# Patient Record
Sex: Male | Born: 1955 | Race: White | Hispanic: No | Marital: Married | Smoking: Former smoker
Health system: Southern US, Community
[De-identification: ages and names within clinical notes are randomized; demographics above are authoritative.]

## PROBLEM LIST (undated history)

## (undated) DIAGNOSIS — R634 Abnormal weight loss: Secondary | ICD-10-CM

## (undated) DIAGNOSIS — Z87891 Personal history of nicotine dependence: Secondary | ICD-10-CM

## (undated) DIAGNOSIS — R202 Paresthesia of skin: Secondary | ICD-10-CM

## (undated) DIAGNOSIS — R61 Generalized hyperhidrosis: Secondary | ICD-10-CM

## (undated) HISTORY — DX: Personal history of nicotine dependence: Z87.891

## (undated) HISTORY — PX: HERNIA REPAIR: SHX51

## (undated) HISTORY — DX: Abnormal weight loss: R63.4

## (undated) HISTORY — DX: Generalized hyperhidrosis: R61

## (undated) HISTORY — DX: Paresthesia of skin: R20.2

## (undated) HISTORY — PX: MIDDLE EAR SURGERY: SHX713

---
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
## 2007-04-06 ENCOUNTER — Emergency Department (HOSPITAL_COMMUNITY): Admission: EM | Admit: 2007-04-06 | Discharge: 2007-04-06 | Payer: Self-pay | Admitting: Emergency Medicine

## 2011-03-29 NOTE — Consult Note (Signed)
Javier Beltran, Javier Beltran                ACCOUNT NO.:  [id number]   MEDICAL RECORD NO.:  [id number]          PATIENT TYPE:  EMS   LOCATION:  [facility]                         FACILITY:  [facility]   PHYSICIAN:  Corky Crafts, MDDATE OF BIRTH:  1956-09-21   DATE OF CONSULTATION:  04/06/2007  DATE OF DISCHARGE:                                 CONSULTATION   REFERRING PHYSICIAN:  Chales Salmon. Abigail Miyamoto, M.D.   REASON FOR CONSULTATION:  Atrial fibrillation.   HISTORY OF PRESENT ILLNESS:  The patient is a 55 year old man who had a  24-hour history of dyspnea, chest pressure and palpitations with  lightheadedness.  He went to his primary care doctor's office today and  was found to be in atrial fibrillation with a heart rate ranging from  106 to 120.  He was transported to [facility].  While  en route to the hospital, he spontaneously converted to a normal sinus  rhythm.  He apparently was having an IV placed and had a vagal reaction.  There was a period of asystole as well and the patient passed out.  He  came to on his own.  He states that he has had syncope related to  phlebotomy each of the last 2 years during his annual physical.  It has  not happened at any other times.  He exercises at least a couple times a  week, and he does not have any significant chest pain or shortness of  breath.  The only thing he did differently last night was that he had  two alcoholic beverages which he normally does not.  He has never had  any thyroid problems.  He does not use caffeine excessively.   PAST MEDICAL HISTORY:  1. Depressions/anxiety.  2. Hematuria   ALLERGIES:  NO KNOWN DRUG ALLERGIES.   MEDICATIONS:  1. Lexapro 10 mg a day.  2. Aspirin 81 mg day.   SOCIAL HISTORY:  The patient does not smoke.  He does not use any  illegal drugs.  He usually drinks one beer a day but he did have more  alcohol last night than usual.   FAMILY HISTORY:  Mother is 76 alive and well with  hypertension.  Father  is 61 and alive and well.  Brother has thyroid disease.   REVIEW OF SYSTEMS:  No recent fevers, chills, no weight loss, no focal  weakness, no rash.  All other systems negative.   PHYSICAL EXAMINATION:  VITAL SIGNS:  Blood pressure is 130/82, heart  rate 88.  GENERAL:  The patient is awake and alert, no apparent distress.  HEAD:  Normocephalic, atraumatic.  EYES:  Extraocular movements intact.  NECK:  No carotid bruits.  CARDIOVASCULAR:  Regular rate and rhythm, S1-S2.  LUNGS:  Clear to auscultation bilaterally.  ABDOMEN:  Soft, nontender, nondistended.  No pulsatile mass.  EXTREMITIES:  Showed no edema, no calf tenderness.  NEUROLOGIC:  No focal deficits.  SKIN:  No rash.  BACK:  No kyphosis.  PSYCHIATRIC:  Normal mood and affect.   EKG:  Initial EKG showed atrial fibrillation with a  heart rate of 106.  The EKG at [facility] showed normal sinus rhythm, no pathologic Q-waves,  no ST-T wave changes, normal ECG.   LABORATORY:  Shows normal troponin, normal CK, and normal MB.  B-MET is  normal.  LFTs are normal.  INR 1.  Hematocrit 47.4, platelets of 139.  Chest x-ray:  No acute abnormalities per my read.   MEDICAL DECISION-MAKING:  A 55 year old with new onset paroxysmal atrial  fibrillation.   PLAN:  1. We will start metoprolol 25 mg extended-release daily.  Hopefully,      this will help keep his heart rates lower if he dose go back into      atrial fibrillation.  2. Aspirin 325 mg daily should be started.  He is already taking 81 mg      a day but I think 325 is a more studied dose in atrial      fibrillation.  3. Since he really does not have any other cardiac issues, he does not      require Coumadin.  4. I will plan on seeing the patient back in the office.  We will      perform an echo to rule out structural heart disease.  I have asked      him to take easy over the weekend and not perform any strenuous      activity.      Corky Crafts, MD  Electronically Signed     JSV/MEDQ  D:  04/06/2007  T:  04/06/2007  Job:  [id number]

## 2012-03-09 ENCOUNTER — Other Ambulatory Visit: Payer: Self-pay | Admitting: Family Medicine

## 2012-03-09 DIAGNOSIS — R51 Headache: Secondary | ICD-10-CM

## 2012-03-15 ENCOUNTER — Ambulatory Visit
Admission: RE | Admit: 2012-03-15 | Discharge: 2012-03-15 | Disposition: A | Discharge status: Home / Self Care | Payer: BC Managed Care – PPO | Source: Ambulatory Visit | Attending: Family Medicine | Admitting: Family Medicine

## 2012-03-15 DIAGNOSIS — R51 Headache: Secondary | ICD-10-CM

## 2012-03-19 ENCOUNTER — Other Ambulatory Visit: Payer: Self-pay | Admitting: Family Medicine

## 2012-03-19 ENCOUNTER — Ambulatory Visit
Admission: RE | Admit: 2012-03-19 | Discharge: 2012-03-19 | Disposition: A | Discharge status: Home / Self Care | Payer: BC Managed Care – PPO | Source: Ambulatory Visit | Attending: Family Medicine | Admitting: Family Medicine

## 2012-03-19 DIAGNOSIS — R51 Headache: Secondary | ICD-10-CM

## 2012-04-11 ENCOUNTER — Ambulatory Visit: Payer: BC Managed Care – PPO | Admitting: Infectious Disease

## 2012-04-26 ENCOUNTER — Ambulatory Visit: Payer: BC Managed Care – PPO | Admitting: Infectious Disease

## 2012-04-30 ENCOUNTER — Ambulatory Visit: Payer: BC Managed Care – PPO | Admitting: Infectious Disease

## 2012-07-04 ENCOUNTER — Encounter: Payer: Self-pay | Admitting: Infectious Disease

## 2012-07-04 ENCOUNTER — Ambulatory Visit (INDEPENDENT_AMBULATORY_CARE_PROVIDER_SITE_OTHER): Payer: BC Managed Care – PPO | Admitting: Infectious Disease

## 2012-07-04 VITALS — BP 115/79 | HR 84 | Temp 97.9°F | Ht 74.0 in | Wt 190.0 lb

## 2012-07-04 DIAGNOSIS — Z87891 Personal history of nicotine dependence: Secondary | ICD-10-CM | POA: Insufficient documentation

## 2012-07-04 DIAGNOSIS — R6883 Chills (without fever): Secondary | ICD-10-CM

## 2012-07-04 DIAGNOSIS — R61 Generalized hyperhidrosis: Secondary | ICD-10-CM | POA: Insufficient documentation

## 2012-07-04 DIAGNOSIS — R202 Paresthesia of skin: Secondary | ICD-10-CM

## 2012-07-04 DIAGNOSIS — R5383 Other fatigue: Secondary | ICD-10-CM

## 2012-07-04 DIAGNOSIS — R5381 Other malaise: Secondary | ICD-10-CM

## 2012-07-04 DIAGNOSIS — R634 Abnormal weight loss: Secondary | ICD-10-CM

## 2012-07-04 DIAGNOSIS — R209 Unspecified disturbances of skin sensation: Secondary | ICD-10-CM

## 2012-07-04 DIAGNOSIS — R51 Headache: Secondary | ICD-10-CM

## 2012-07-04 DIAGNOSIS — R519 Headache, unspecified: Secondary | ICD-10-CM | POA: Insufficient documentation

## 2012-07-04 LAB — CK: Total CK: 42 U/L (ref 7–232)

## 2012-07-04 NOTE — Progress Notes (Signed)
Subjective:    Patient ID: Javier Beltran, male    DOB: 1956-05-28, 56 y.o.   MRN: [id number]  HPI  56 year old Caucasian man followed by Dr. Larwance Sachs with [facility]. 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  He developed symptoms of headache in November that he thought was due to sinus infection. Tried Nettie pot, otc. Had no fevers. rx with flonase and Zpack. He then developed pain over his left temple that has waxed and waned since then. He was rx by PCP with flonase, cetirizine and ibuprofen. IN December he developed more painful scalp. He saw Dr Suszanne Conners from ENT who rx him prednisone course. Later in December he developed pain in his elbow, painful paresthesia there. Had sunburn like sensation with clotehes touching. He again saw Dr. Suszanne Conners in January and again did steroid course with gabapenetin. Temple pain imrpvoed, scalp still bad and with pruritits. In Feb saw Dr. Jennelle [facility] when pt had pain in his face left side and she speculated about zoster and gave pt valtrex and lyrica. NOw pt in March developed pain in arm, waist with scalp still prominent. Over this time period he had also c/o drenching night sweats requiring him to change his shirts. He also had unexplained 15# weight loss. He has been worked up by PCP with negative HIV ELISA, normal Thyroid function tests, unrremarkable cbc, cmp and non contrasted head CT normal testosterone. He actually has been feeling better over the past few weeks now having gained 8 pounds back and with less night sweats.   He has no known hx of exposure to TB. He is from [place] and travels there every summer. He has travelled throughout the [place] including [place] He has been to [place] , [place] and the [place] He has no hx of STDs or IVDU.   He and his wife inquired about dx of Lyme and I explained that testing for Lyme was not indicated  based on his clinical syndrome and that the test would more likely yield of false positive result if it was positive at all. (CDC does not recommend testing outside proper clinical syndrome)  I spent greater than 60 minutes with the patient including greater than 50% of time in face to face counsel of the patient and in coordination of their care.    Review of Systems  Constitutional: Positive for diaphoresis, fatigue and unexpected weight change. Negative for fever, chills, activity change and appetite change.  HENT: Positive for ear pain. Negative for congestion, sore throat, rhinorrhea, sneezing, trouble swallowing and sinus pressure.   Eyes: Negative for photophobia and visual disturbance.  Respiratory: Negative for cough, chest tightness, shortness of breath, wheezing and stridor.   Cardiovascular: Negative for chest pain, palpitations and leg swelling.  Gastrointestinal: Negative for nausea, vomiting, abdominal pain, diarrhea, constipation, blood in stool, abdominal distention and anal bleeding.  Genitourinary: Negative for dysuria, hematuria, flank pain and difficulty urinating.  Musculoskeletal: Negative for myalgias, back pain, joint swelling, arthralgias and gait problem.  Skin: Negative for color change, pallor, rash and wound.  Neurological: Positive for headaches. Negative for dizziness, tremors, weakness and light-headedness.  Hematological: Negative for adenopathy. Does not bruise/bleed easily.  Psychiatric/Behavioral: Negative for behavioral problems, confusion, disturbed wake/sleep cycle, dysphoric mood, decreased concentration and agitation.       Objective:   Physical Exam  Constitutional: He is oriented to person, place,  and time. He appears well-developed and well-nourished. No distress.  HENT:  Head: Normocephalic and atraumatic.  Right Ear: Tympanic membrane is scarred.  Left Ear: Hearing and tympanic membrane normal.  Mouth/Throat: Oropharynx is clear and moist.  No oropharyngeal exudate.  Eyes: Conjunctivae and EOM are normal. Pupils are equal, round, and reactive to light. No scleral icterus.  Neck: Normal range of motion. Neck supple. No JVD present.  Cardiovascular: Normal rate, regular rhythm and normal heart sounds.  Exam reveals no gallop and no friction rub.   No murmur heard. Pulmonary/Chest: Effort normal and breath sounds normal. No respiratory distress. He has no wheezes. He has no rales. He exhibits no tenderness.  Abdominal: He exhibits no distension and no mass. There is no tenderness. There is no rebound and no guarding.  Musculoskeletal: He exhibits no edema and no tenderness.  Lymphadenopathy:    He has no cervical adenopathy.  Neurological: He is alert and oriented to person, place, and time. He has normal reflexes. He exhibits normal muscle tone. Coordination normal.  Skin: Skin is warm and dry. He is not diaphoretic. No erythema. No pallor.  Psychiatric: He has a normal mood and affect. His behavior is normal. Judgment and thought content normal.          Assessment & Plan:  Weight loss I see no evidence for infection with no fevers. His weight is actually now improvign. I do NOT wish to embark on an exhaustive and costly FUO workup absent a fever and with clinical improvement. I will send hep panel, HIV RNA, Quantiferon gold cmp, cbc ESR, CRP, ferritin, ANA, CPK and CT WITH contrast. I will bring him back in one months time.  Paresthesias Not clear what cause is here  Headache I will check CT head WITH contrast, ESR

## 2012-07-04 NOTE — Assessment & Plan Note (Signed)
I will check CT head WITH contrast, ESR

## 2012-07-04 NOTE — Assessment & Plan Note (Signed)
Not clear what cause is here

## 2012-07-04 NOTE — Assessment & Plan Note (Addendum)
I see no evidence for infection with no fevers. His weight is actually now improvign. I do NOT wish to embark on an exhaustive and costly FUO workup absent a fever and with clinical improvement. I will send hep panel, HIV RNA, Quantiferon gold cmp, cbc ESR, CRP, ferritin, ANA, CPK and CT WITH contrast. I will bring him back in one months time.

## 2012-07-05 ENCOUNTER — Other Ambulatory Visit (HOSPITAL_COMMUNITY): Payer: BC Managed Care – PPO

## 2012-07-05 LAB — ANA: Anti Nuclear Antibody(ANA): NEGATIVE

## 2012-07-05 LAB — SEDIMENTATION RATE: Sed Rate: 1 mm/hr (ref 0–16)

## 2012-07-05 LAB — HEPATITIS PANEL, ACUTE
HCV Ab: NEGATIVE
Hep A IgM: NEGATIVE

## 2012-07-06 ENCOUNTER — Telehealth: Payer: Self-pay | Admitting: *Deleted

## 2012-07-06 ENCOUNTER — Inpatient Hospital Stay (HOSPITAL_COMMUNITY)
Admission: RE | Admit: 2012-07-06 | Discharge: 2012-07-06 | Payer: BC Managed Care – PPO | Source: Ambulatory Visit | Attending: Infectious Disease | Admitting: Infectious Disease

## 2012-07-06 NOTE — Telephone Encounter (Signed)
CT originally scheduled for 07/05/12, then moved to 07/06/12 because unable to reach patient, only one phone number and no answering machine.  Tamika tried multiple times on Wednesday, and I tried multiple times on Thursday and again Friday morning.  Cancelled this test until patient calls the clinic. Wendall Mola CMA

## 2012-07-06 NOTE — Telephone Encounter (Signed)
We will just have to try next week and then see if PCP can help [place] out here

## 2012-08-06 ENCOUNTER — Ambulatory Visit (INDEPENDENT_AMBULATORY_CARE_PROVIDER_SITE_OTHER): Payer: BC Managed Care – PPO | Admitting: Infectious Disease

## 2012-08-06 ENCOUNTER — Encounter: Payer: Self-pay | Admitting: Infectious Disease

## 2012-08-06 VITALS — BP 109/75 | HR 102 | Ht 74.0 in | Wt 191.2 lb

## 2012-08-06 DIAGNOSIS — R5383 Other fatigue: Secondary | ICD-10-CM

## 2012-08-06 DIAGNOSIS — R202 Paresthesia of skin: Secondary | ICD-10-CM

## 2012-08-06 DIAGNOSIS — R209 Unspecified disturbances of skin sensation: Secondary | ICD-10-CM

## 2012-08-06 DIAGNOSIS — R51 Headache: Secondary | ICD-10-CM

## 2012-08-06 DIAGNOSIS — R61 Generalized hyperhidrosis: Secondary | ICD-10-CM

## 2012-08-06 NOTE — Assessment & Plan Note (Signed)
I suspect the underlying pathology is chronic fatigue but again would like fever diary done

## 2012-08-06 NOTE — Assessment & Plan Note (Signed)
See above

## 2012-08-06 NOTE — Assessment & Plan Note (Signed)
Not clear what cause is. As above would get CT with contrast of the head, Keep a fever diary. If he IS having actual fevers and workup still negative will proceed to get CT chest abdomen and pelvis and pursue more aggressive workup for FUO

## 2012-08-06 NOTE — Assessment & Plan Note (Signed)
Dont think these have ANYTHING to do with an Infectious Disease

## 2012-08-06 NOTE — Progress Notes (Signed)
Subjective:    Patient ID: Javier Beltran, male    DOB: 1956-04-19, 56 y.o.   MRN: [id number]  HPI  56 year old Caucasian man followed by Dr. Larwance Sachs with [facility] referred to me as an ID consult for various symptoms but chiefly malaise, fatigue, paresthesias and night sweats.   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 He first started   He developed symptoms of headache in November that he thought was due to sinus infection. Tried Nettie pot, otc. Had no fevers. rx with flonase and Zpack. He then developed pain over his left temple that has waxed and waned since then. He was rx by PCP with flonase, cetirizine and ibuprofen. IN December he developed more painful scalp. He saw Dr Suszanne Conners from ENT who rx him prednisone course. Later in December he developed pain in his elbow, painful paresthesia there. Had sunburn like sensation with clotehes touching. He again saw Dr. Suszanne Conners in January and again did steroid course with gabapenetin. Temple pain imrpvoed, scalp still bad and with pruritits. In Feb saw Dr. Jenn[facility] Neurolog[facility] when pt had pain in his face left side and she speculated about zoster and gave pt valtrex and lyrica. IN March developed pain in arm, waist with scalp still prominent. Over this time period he had also c/o drenching night sweats requiring him to change his shirts. He also had unexplained 15# weight loss. He has been worked up by PCP with negative HIV ELISA, normal Thyroid function tests, unrremarkable cbc, cmp and non contrasted head CT normal testosterone. He actually has been feeling better over the past few weeks now having gained 8 pounds back and with less night sweats. He and his wife  Were SPECIFICALLY INTERESTED about diagnosis  of Lyme when I last saw them and at that time  I explained that testing for Lyme was not indicated based on his  clinical syndrome and that the test would more likely yield of false positive result if it was positive at all. (CDC does not recommend testing outside proper clinical syndrome)   When I saw the pt here in August I tested him and found his ESR, CRP ANA ferritin, hepatitis panel, CK all to be completely normal.  He returns today with his wife and has approximately 5 pages of TYPED NOTES RE his symptoms. He apparently had diffuse rash on chest arms that has since largely resolved.  We again discussed obtaining a CT of the head with contrast which the pt claims was not done because he was not called back for scan. Today he and his wife seem reluctant because they are worried that contrast may cause problems.  My recommendation for next diagnostic test would still be to get CT with contrast of the head. Beyond that I recommend he keep a fever and temperature diary--he does not yet have a thermometer!  I spent greater than 45 minutes with the patient including greater than 50% of time in face to face counsel of the patient and in coordination of their care.      Review of Systems  Constitutional: Positive for chills and fatigue. Negative for fever, diaphoresis, activity change, appetite change and unexpected weight change.  HENT: Negative for congestion, sore throat, rhinorrhea, sneezing, trouble swallowing and sinus pressure.   Eyes: Negative for photophobia and visual disturbance.  Respiratory: Negative for cough, chest  tightness, shortness of breath, wheezing and stridor.   Cardiovascular: Negative for chest pain, palpitations and leg swelling.  Gastrointestinal: Negative for nausea, vomiting, abdominal pain, diarrhea, constipation, blood in stool, abdominal distention and anal bleeding.  Genitourinary: Negative for dysuria, hematuria, flank pain and difficulty urinating.  Musculoskeletal: Negative for myalgias, back pain, joint swelling, arthralgias and gait problem.  Skin: Positive for rash.  Negative for color change, pallor and wound.  Neurological: Positive for headaches. Negative for dizziness, tremors, weakness and light-headedness.  Hematological: Negative for adenopathy. Does not bruise/bleed easily.  Psychiatric/Behavioral: Negative for behavioral problems, confusion, disturbed wake/sleep cycle, dysphoric mood, decreased concentration and agitation.       Objective:   Physical Exam  Constitutional: He is oriented to person, place, and time. He appears well-developed and well-nourished. No distress.  HENT:  Head: Normocephalic and atraumatic.  Mouth/Throat: Oropharynx is clear and moist. No oropharyngeal exudate.  Eyes: Conjunctivae normal and EOM are normal. Pupils are equal, round, and reactive to light. No scleral icterus.  Neck: Normal range of motion. Neck supple. No JVD present.  Cardiovascular: Normal rate, regular rhythm and normal heart sounds.  Exam reveals no gallop and no friction rub.   No murmur heard. Pulmonary/Chest: Effort normal and breath sounds normal. No respiratory distress. He has no wheezes. He has no rales. He exhibits no tenderness.  Abdominal: He exhibits no distension and no mass. There is no tenderness. There is no rebound and no guarding.  Musculoskeletal: He exhibits no edema and no tenderness.  Lymphadenopathy:    He has no cervical adenopathy.  Neurological: He is alert and oriented to person, place, and time. He has normal reflexes. He exhibits normal muscle tone. Coordination normal.  Skin: Skin is warm and dry. He is not diaphoretic. No erythema. No pallor.     Psychiatric: His behavior is normal. Judgment and thought content normal. He exhibits a depressed mood.          Assessment & Plan:  Night sweats Not clear what cause is. As above would get CT with contrast of the head, Keep a fever diary. If he IS having actual fevers and workup still negative will proceed to get CT chest abdomen and pelvis and pursue more aggressive  workup for FUO  Fatigue I suspect the underlying pathology is chronic fatigue but again would like fever diary done  Paresthesias Dont think these have ANYTHING to do with an Infectious Disease  Headache See above

## 2012-08-09 ENCOUNTER — Telehealth: Payer: Self-pay | Admitting: *Deleted

## 2012-08-09 NOTE — Telephone Encounter (Signed)
Pt would like a call back tomorrow, Friday, Sept 27, between 8:30 and 3:30 PM to discuss scheduling CT w/ Contrast ordered by KVD.

## 2012-08-14 ENCOUNTER — Other Ambulatory Visit: Payer: Self-pay | Admitting: Licensed Clinical Social Worker

## 2012-08-14 DIAGNOSIS — R509 Fever, unspecified: Secondary | ICD-10-CM

## 2012-10-08 ENCOUNTER — Ambulatory Visit: Payer: BC Managed Care – PPO | Admitting: Infectious Disease

## 2012-10-19 ENCOUNTER — Other Ambulatory Visit (HOSPITAL_COMMUNITY): Payer: BC Managed Care – PPO

## 2012-10-24 ENCOUNTER — Ambulatory Visit: Payer: BC Managed Care – PPO | Admitting: Infectious Disease

## 2012-12-17 ENCOUNTER — Encounter: Payer: Self-pay | Admitting: Vascular Surgery

## 2012-12-18 ENCOUNTER — Encounter: Payer: Self-pay | Admitting: Vascular Surgery

## 2012-12-18 ENCOUNTER — Ambulatory Visit (INDEPENDENT_AMBULATORY_CARE_PROVIDER_SITE_OTHER): Payer: BC Managed Care – PPO | Admitting: Vascular Surgery

## 2012-12-18 VITALS — BP 133/81 | HR 95 | Resp 16 | Ht 74.0 in | Wt 192.0 lb

## 2012-12-18 DIAGNOSIS — R51 Headache: Secondary | ICD-10-CM

## 2012-12-18 NOTE — Progress Notes (Signed)
Subjective:     Patient ID: Javier Beltran, male   DOB: 04/10/1956, 57 y.o.   MRN: [id number]  HPI this 57 year old male was referred by [facility] for evaluation of possible temporal arteritis. The patient began having headaches about one year ago. This consisted of some left temporal pain and also pain and paresthesia on his scalp. He has been evaluated by many physicians including 2 neurologists. He has had no visual changes left eye. 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 He has had citrates checked on 2 occasions which have been normal most recently 5. C. reactive protein was checked and it was slightly elevated. Currently her symptoms seem to be slightly improved although he does have paresthesias in his scalp and noticed a prominent blood vessel in his left temporal area recently. He has no history of connective tissue disorders.  Past Medical History  Diagnosis Date  . Night sweats   . Weight loss   . Paresthesias   . Former smoker     History  Substance Use Topics  . Smoking status: Former Smoker    Quit date: 07/05/1979  . Smokeless tobacco: Never Used  . Alcohol Use: 4.2 oz/week    3 Glasses of wine, 3 Cans of beer, 1 Shots of liquor per week    Family History  Problem Relation Age of Onset  . Cancer Mother     breast  . Deep vein thrombosis Mother   . Diabetes Mother   . Hypertension Mother   . Hypertension Father   . Cancer Sister     ovarian    Allergies  Allergen Reactions  . Asa (Aspirin) Other (See Comments)    Ringing of the ears     No current outpatient prescriptions on file.  BP 133/81  Pulse 95  Resp 16  Ht 6\' 2"  (1.88 m)  Wt 192 lb (87.091 kg)  BMI 24.65 kg/m2  SpO2 100%  Body mass index is 24.65 kg/(m^2).          Review of Systems denies chest pain, dyspnea on exertion, PND, orthopnea, hemoptysis, claudication.    Objective:   Physical Exam blood pressure 133 1 heart rate 95  respirations 16 Gen.-alert and oriented x3 in no apparent distress HEENT normal for age-no prominent vessels palpable in left temporal area. 2+ superficial temporal pulse the vessel does not fill thickened or inflamed to palpation Lungs no rhonchi or wheezing Cardiovascular regular rhythm no murmurs carotid pulses 3+ palpable no bruits audible Abdomen soft nontender no palpable masses Musculoskeletal free of  major deformities Skin clear -no rashes Neurologic normal Lower extremities 3+ femoral and dorsalis pedis pulses palpable bilaterally with no edema       Assessment:     History of headaches and scalp paresthesias-undiagnosed etiology Patient concerned about possible temporal arteritis because he read about it on the Internet-frustrated because of lack of diagnosis  #1 doubt patient has temporal arteritis based on normal sedimentation rate and physical exam in symptom complex Discusses with patient If further evaluation is indicated I would recommend evaluation by rheumatologists to see if they feel temporal artery biopsy is indicated I feel biopsy will almost certainly be negative it would certainly be willing to perform this if recommended by rheumatologist    Plan:     Patient to return to [facility] to discuss whether referral to rheumatologist would be indicated

## 2014-03-20 ENCOUNTER — Ambulatory Visit
Admission: RE | Admit: 2014-03-20 | Discharge: 2014-03-20 | Disposition: A | Discharge status: Home / Self Care | Payer: BC Managed Care – PPO | Source: Ambulatory Visit | Attending: Family Medicine | Admitting: Family Medicine

## 2014-03-20 ENCOUNTER — Other Ambulatory Visit: Payer: Self-pay | Admitting: Family Medicine

## 2014-03-20 DIAGNOSIS — M25511 Pain in right shoulder: Secondary | ICD-10-CM

## 2014-05-28 ENCOUNTER — Encounter: Payer: Self-pay | Admitting: *Deleted

## 2016-07-19 ENCOUNTER — Other Ambulatory Visit: Payer: Self-pay | Admitting: Surgery

## 2016-10-11 ENCOUNTER — Telehealth: Payer: Self-pay | Admitting: Cardiovascular Disease

## 2016-10-11 NOTE — Telephone Encounter (Signed)
[facility]16 with Dr Allyson SabalBerry.  [facility] for Dr Hazle CocaBerry's schedule on 10/12/16. lp

## 2016-10-12 ENCOUNTER — Encounter (INDEPENDENT_AMBULATORY_CARE_PROVIDER_SITE_OTHER): Payer: BLUE CROSS/BLUE SHIELD

## 2016-10-12 ENCOUNTER — Telehealth: Payer: Self-pay | Admitting: Cardiovascular Disease

## 2016-10-12 ENCOUNTER — Ambulatory Visit (INDEPENDENT_AMBULATORY_CARE_PROVIDER_SITE_OTHER): Payer: BLUE CROSS/BLUE SHIELD | Admitting: Cardiovascular Disease

## 2016-10-12 ENCOUNTER — Encounter: Payer: Self-pay | Admitting: Cardiovascular Disease

## 2016-10-12 VITALS — BP 102/80 | HR 80 | Ht 74.0 in | Wt 183.0 lb

## 2016-10-12 DIAGNOSIS — R002 Palpitations: Secondary | ICD-10-CM | POA: Diagnosis not present

## 2016-10-12 NOTE — Telephone Encounter (Signed)
Clearance letter faxed to number provided via EP[employer] per [facility].

## 2016-10-12 NOTE — Progress Notes (Signed)
10/12/2016 Javier Beltran   12/28/1955  [id number]  Primary Physician Javier Beltran, AARON, Beltran Primary Cardiologist: Javier Beltran Javier RenoFACP, FACC, FAHA, FSCAI  HPI:  [name] is a delightful 60 year old thin appearing single Caucasian male who children who is accompanied by his domestic partner Javier Beltran today. He was referred by his orthopedic surgeon ,  Javier Beltran [facility] for preoperative clearance before prior to redo left shoulder surgery which has been complicated in the past by infection. He has no cardiac risk factors. 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 He denies chest pain or shortness of breath.    Current Outpatient Prescriptions  Medication Sig Dispense Refill  . Acetaminophen (TYLENOL 8 HOUR PO) Take 1 tablet by mouth daily as needed.    Marland Kitchen. b complex vitamins tablet Take 1 tablet by mouth daily.    Marland Kitchen. HYDROcodone-acetaminophen (NORCO/VICODIN) 5-325 MG tablet Take 1 tablet by mouth daily as needed.    . Ibuprofen (ADVIL PO) Take 1 tablet by mouth daily as needed.    Marland Kitchen. levofloxacin (LEVAQUIN) 250 MG tablet Take 250 mg by mouth daily.    Marland Kitchen. loratadine (CLARITIN) 10 MG tablet Take 10 mg by mouth daily as needed for allergies.    . Multiple Vitamin (TAB-A-VITE) TABS Take 1 tablet by mouth daily.    . vitamin C (ASCORBIC ACID) 500 MG tablet Take 500 mg by mouth daily.     No current facility-administered medications for this visit.     Allergies  Allergen Reactions  . Asa [Aspirin] Other (See Comments)    Ringing of the ears     Social History   Social History  . Marital status: Married    Spouse name: N/A  . Number of children: N/A  . Years of education: N/A   Occupational History  . Not on file.   Social History Main Topics  . Smoking status: Former Smoker    Quit date: 07/04/1986  . Smokeless tobacco: Never Used  . Alcohol use 4.2 oz/week    3 Glasses of wine, 3 Cans of beer, 1 Shots of  liquor per week  . Drug use: No  . Sexual activity: Yes   Other Topics Concern  . Not on file   Social History Narrative  . No narrative on file     Review of Systems: General: negative for chills, fever, night sweats or weight changes.  Cardiovascular: negative for chest pain, dyspnea on exertion, edema, orthopnea, palpitations, paroxysmal nocturnal dyspnea or shortness of breath Dermatological: negative for rash Respiratory: negative for cough or wheezing Urologic: negative for hematuria Abdominal: negative for nausea, vomiting, diarrhea, bright red blood per rectum, melena, or hematemesis Neurologic: negative for visual changes, syncope, or dizziness All other systems reviewed and are otherwise negative except as noted above.    Blood pressure 102/80, pulse 80, height 6\' 2"  (1.88 m), weight 183 lb (83 kg).  General appearance: alert and no distress Neck: no adenopathy, no carotid bruit, no JVD, supple, symmetrical, trachea midline and thyroid not enlarged, symmetric, no tenderness/mass/nodules Lungs: clear to auscultation bilaterally Heart: regular rate and rhythm, S1, S2 normal, no murmur, click, rub or gallop Extremities: extremities normal, atraumatic, no cyanosis or edema  EKG normal sinus rhythm at 80 without ST or T-wave changes. I personally reviewed this EKG.ENT AND PLAN:   Palpitations [name] was referred by his orthopedic surgeon, [facility]  for preoperative clearance before we do left shoulder surgery. Basically has no cortical factors. 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 He was seen by Dr. Eldridge Beltran at that time. He denies chest pain or shortness of breath. His wife says that she has heard skip beats listening to his chest which he is only recently aware of. I'm going to get a 2 week event monitor to further characterize these but will clear him at  low risk for his upcoming surgery.      Javier GessJonathan J. Galileah Piggee Beltran FACP,FACC,FAHA, [facility] 10/12/2016 11:04 AM

## 2016-10-12 NOTE — Assessment & Plan Note (Signed)
[name] was referred by his orthopedic surgeon, Dr. Noralyn Pickarroll at [facility] for preoperative clearance before we do left shoulder surgery. Basically has no cortical factors. 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 He was seen by Dr. Eldridge DaceVaranasi at that time. He denies chest pain or shortness of breath. His wife says that she has heard skip beats listening to his chest which he is only recently aware of. I'm going to get a 2 week event monitor to further characterize these but will clear him at low risk for his upcoming surgery.

## 2016-10-12 NOTE — Patient Instructions (Signed)
Medication Instructions: Your physician recommends that you continue on your current medications as directed. Please refer to the Current Medication list given to you today.  Testing/Procedures: Your physician has recommended that you wear an event monitor for 2 weeks. Event monitors are medical devices that record the heart's electrical activity. Doctors most often us these monitors to diagnose arrhythmias. Arrhythmias are problems with the speed or rhythm of the heartbeat. The monitor is a small, portable device. You can wear one while you do your normal daily activities. This is usually used to diagnose what is causing palpitations/syncope (passing out). This will be placed at [address].   Follow-Up: Your physician recommends that you schedule a follow-up appointment as needed with Dr. Allyson SabalBerry.   Any Other Special Instructions   You have been cleared for shoulder surgery at low risk. No further testing is needed.   If you need a refill on your cardiac medications before your next appointment, please call your pharmacy.

## 2016-10-25 DIAGNOSIS — R002 Palpitations: Secondary | ICD-10-CM

## 2016-12-26 ENCOUNTER — Other Ambulatory Visit: Payer: Self-pay | Admitting: Orthopedic Surgery

## 2016-12-26 DIAGNOSIS — M25512 Pain in left shoulder: Secondary | ICD-10-CM

## 2016-12-28 ENCOUNTER — Ambulatory Visit
Admission: RE | Admit: 2016-12-28 | Discharge: 2016-12-28 | Disposition: A | Discharge status: Home / Self Care | Payer: BLUE CROSS/BLUE SHIELD | Source: Ambulatory Visit | Attending: Orthopedic Surgery | Admitting: Orthopedic Surgery

## 2016-12-28 DIAGNOSIS — M25512 Pain in left shoulder: Secondary | ICD-10-CM

## 2016-12-30 ENCOUNTER — Other Ambulatory Visit: Payer: BLUE CROSS/BLUE SHIELD

## 2018-05-18 IMAGING — CT CT SHOULDER*L* W/O CM
1 series · 12 of 14 positions shown, 15 images · non-contrast
Comparison: None.

CLINICAL DATA: Left shoulder pain with limited range of motion.

EXAM:
CT OF THE UPPER LEFT EXTREMITY WITHOUT CONTRAST
TECHNIQUE: Multidetector CT imaging of the upper left extremity was performed
according to the standard protocol.

[Series 3: soft tissue · axial · 0.49mm/px · z∈[-247,-75]mm · 12 of 102 slices shown, 15 images]
[im 8/102  soft-tissue]
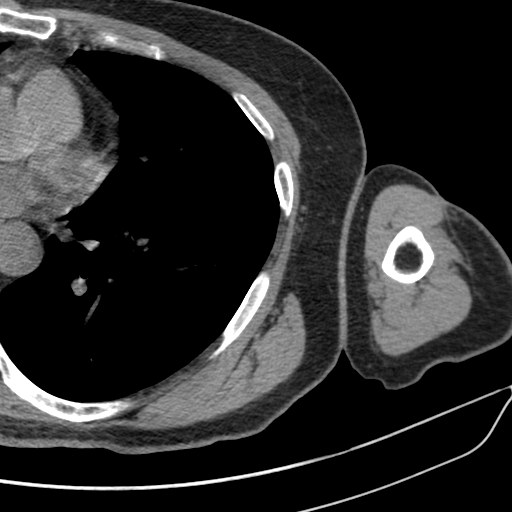
[im 8/102  bone]
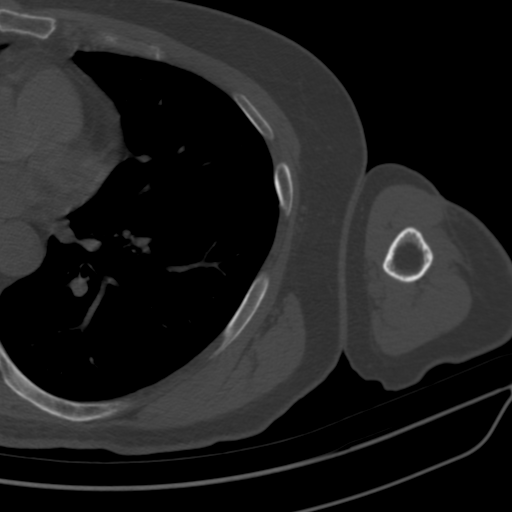
[im 16/102  bone]
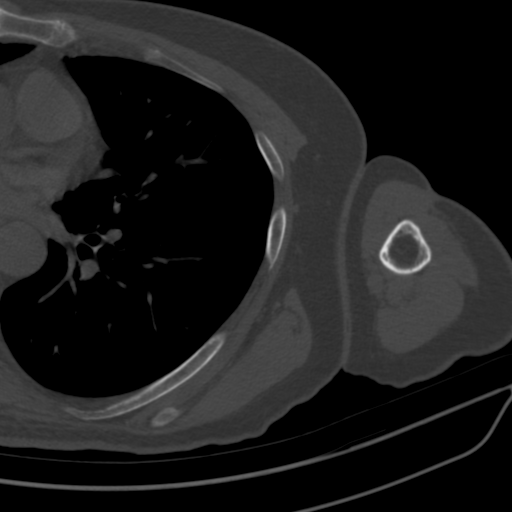
[im 24/102  bone]
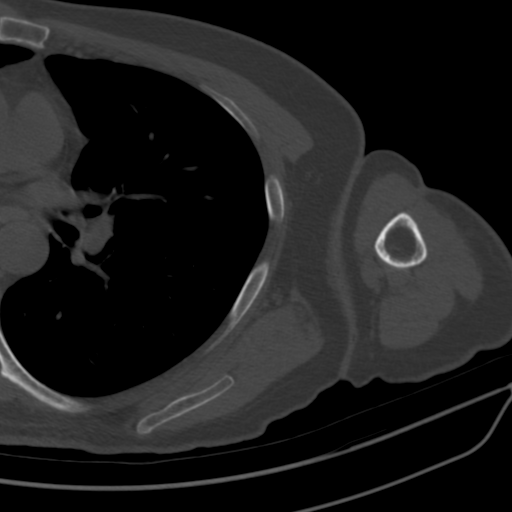
[im 32/102  bone]
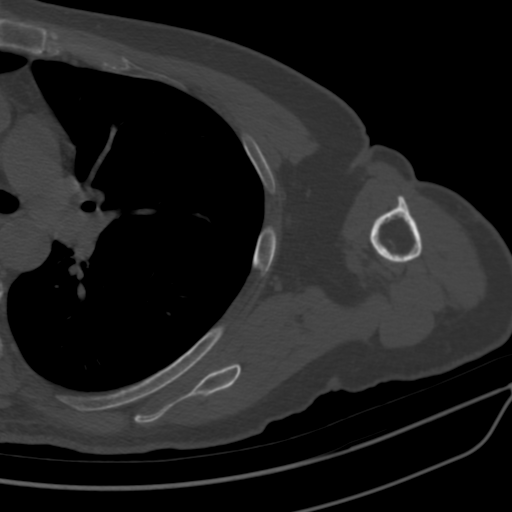
[im 39/102  soft-tissue]
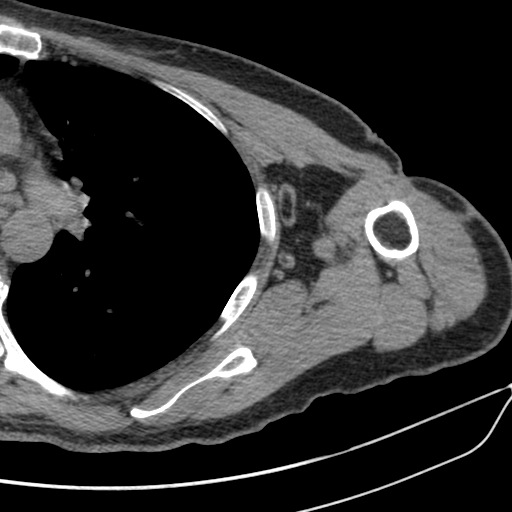
[im 39/102  bone]
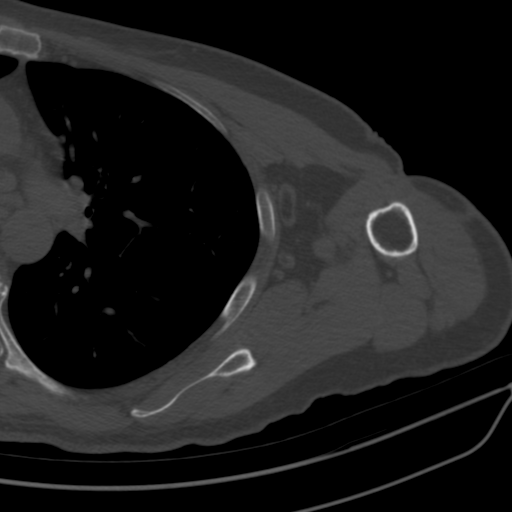
[im 47/102  bone]
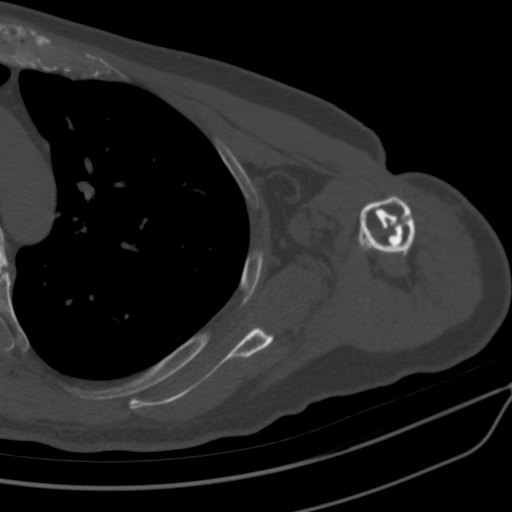
[im 55/102  bone]
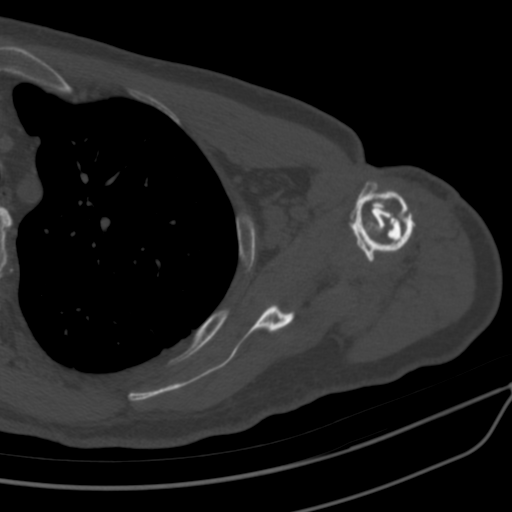
[im 63/102  bone]
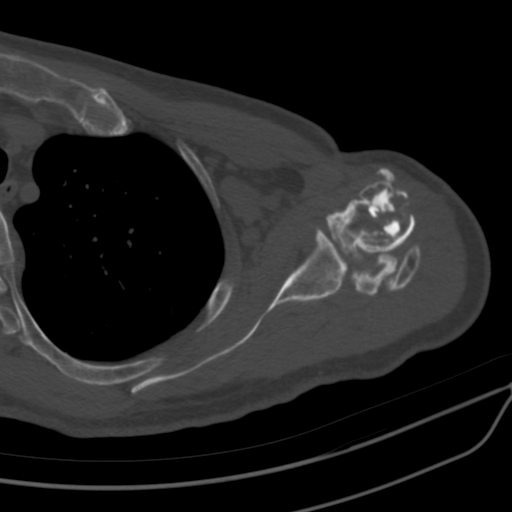
[im 70/102  soft-tissue]
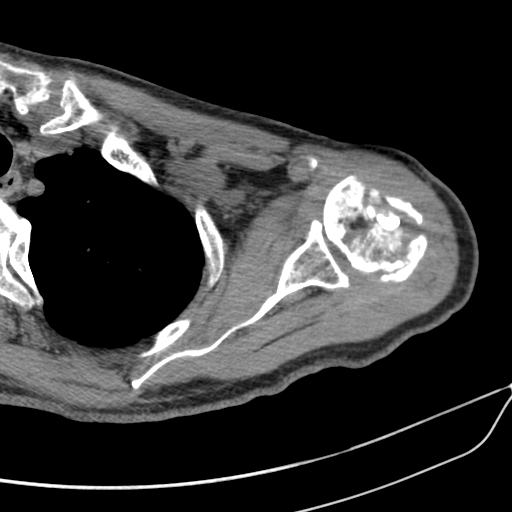
[im 70/102  bone]
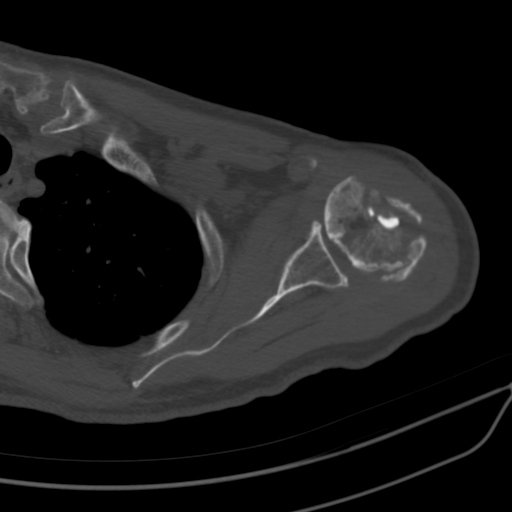
[im 78/102  bone]
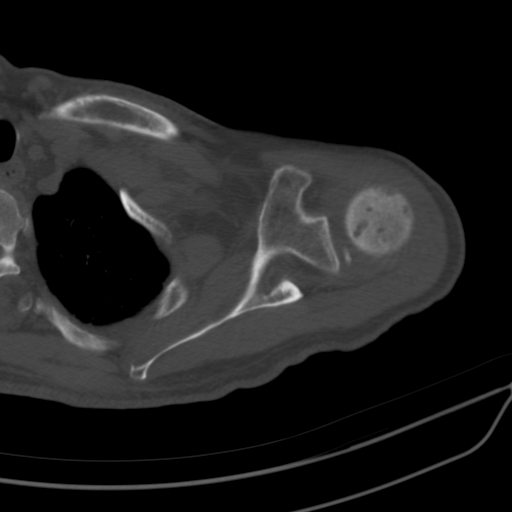
[im 86/102  bone]
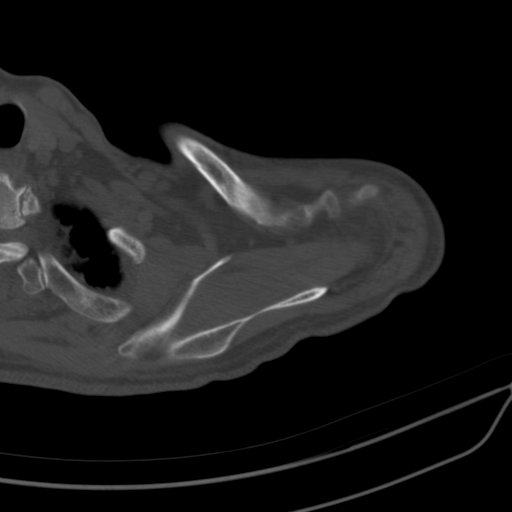
[im 94/102  bone]
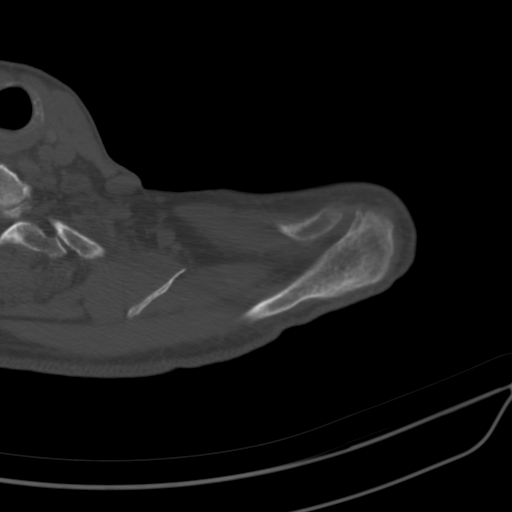

[12 of 14 positions shown; findings below may reference images not displayed]

FINDINGS: Bones/Joint/Cartilage

Chronic partially ununited comminuted fracture of the left humeral
surgical neck and humeral head transfix with a small intramedullary
nail transfixing the surgical neck. Increased density in the left
humeral head concerning for avascular necrosis.

Mild osteoarthritis of the left glenohumeral joint. Small loose body
along the superior glenohumeral joint.

No acute fracture or dislocation. Normal acromioclavicular joint.
Type II acromion. Normal alignment.

Ligaments

Ligaments are suboptimally evaluated by CT.

Muscles and Tendons
Muscles are normal.  No muscle atrophy.

Soft tissue
No fluid collection or hematoma.  No soft tissue mass.
IMPRESSION: Chronic partially ununited comminuted fracture of the left humeral
surgical neck and humeral head. Increased density in the left
humeral head concerning for avascular necrosis.
